# Patient Record
Sex: Female | Born: 1970 | Hispanic: Yes | Marital: Married | Smoking: Never smoker
Health system: Southern US, Community
[De-identification: ages and names within clinical notes are randomized; demographics above are authoritative.]

## PROBLEM LIST (undated history)

## (undated) DIAGNOSIS — K219 Gastro-esophageal reflux disease without esophagitis: Secondary | ICD-10-CM

## (undated) DIAGNOSIS — Z973 Presence of spectacles and contact lenses: Secondary | ICD-10-CM

---

## 2004-11-13 ENCOUNTER — Ambulatory Visit: Payer: Self-pay

## 2013-01-04 ENCOUNTER — Ambulatory Visit: Payer: Self-pay

## 2014-10-02 IMAGING — MG MAM DGTL SCRN MAM NO ORDER W/CAD
1 series · 4 of 4 positions shown · non-contrast
Comparison: none

REASON FOR EXAM: SCR MAMMO NO ORDER BASELINE HAMADAMIN SOMO PH [REDACTED] [id number] [phone]
COMMENTS:

[R CC · right · 4 of 4 slices shown]
[im 1/4]
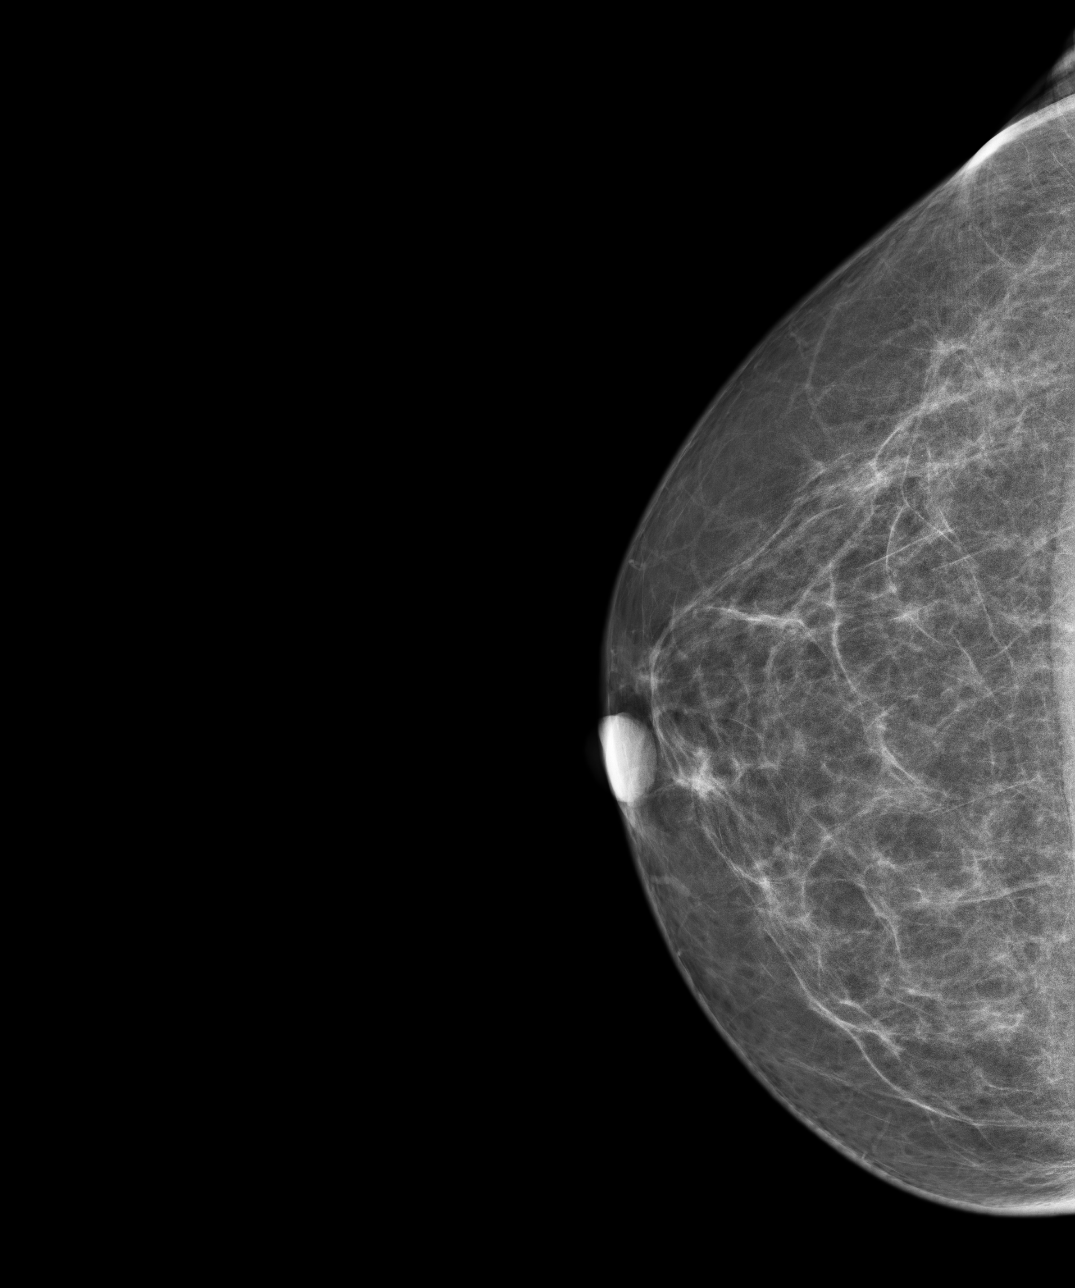
[im 2/4]
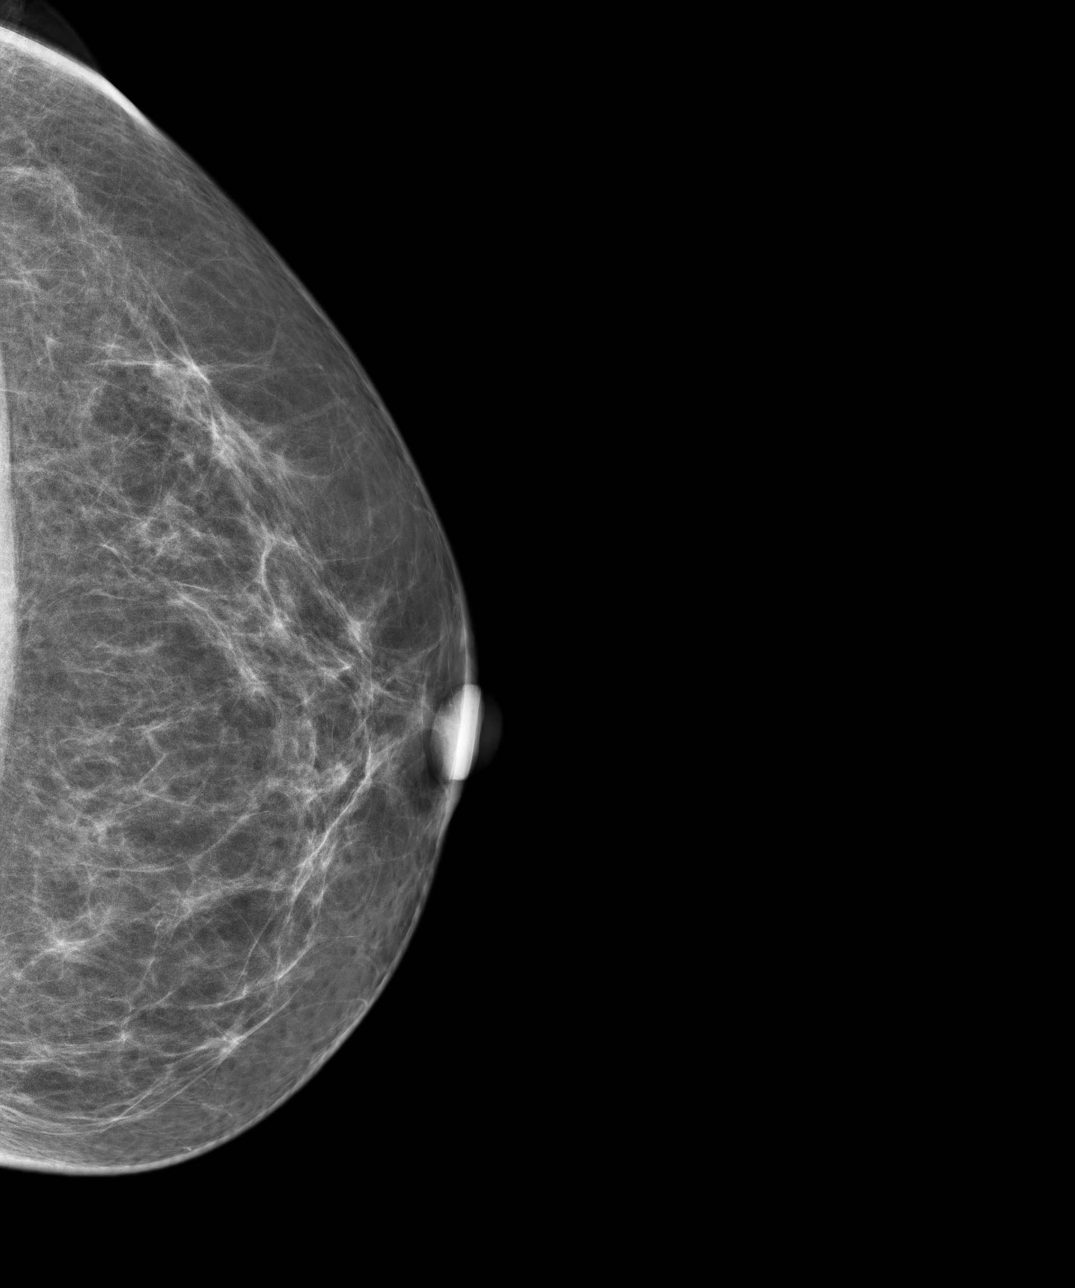
[im 3/4]
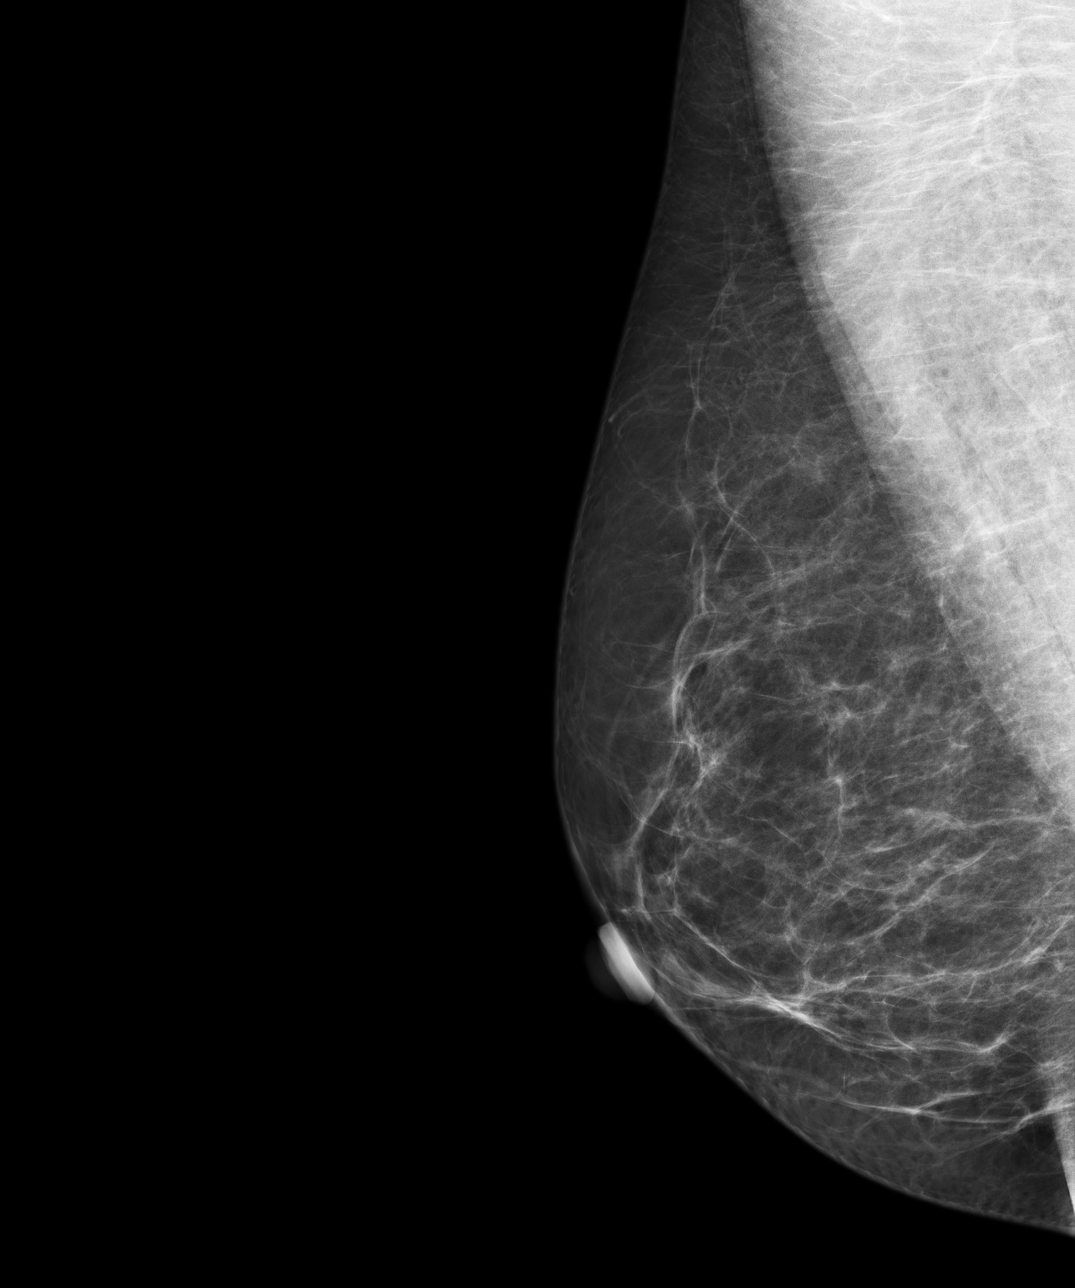
[im 4/4]
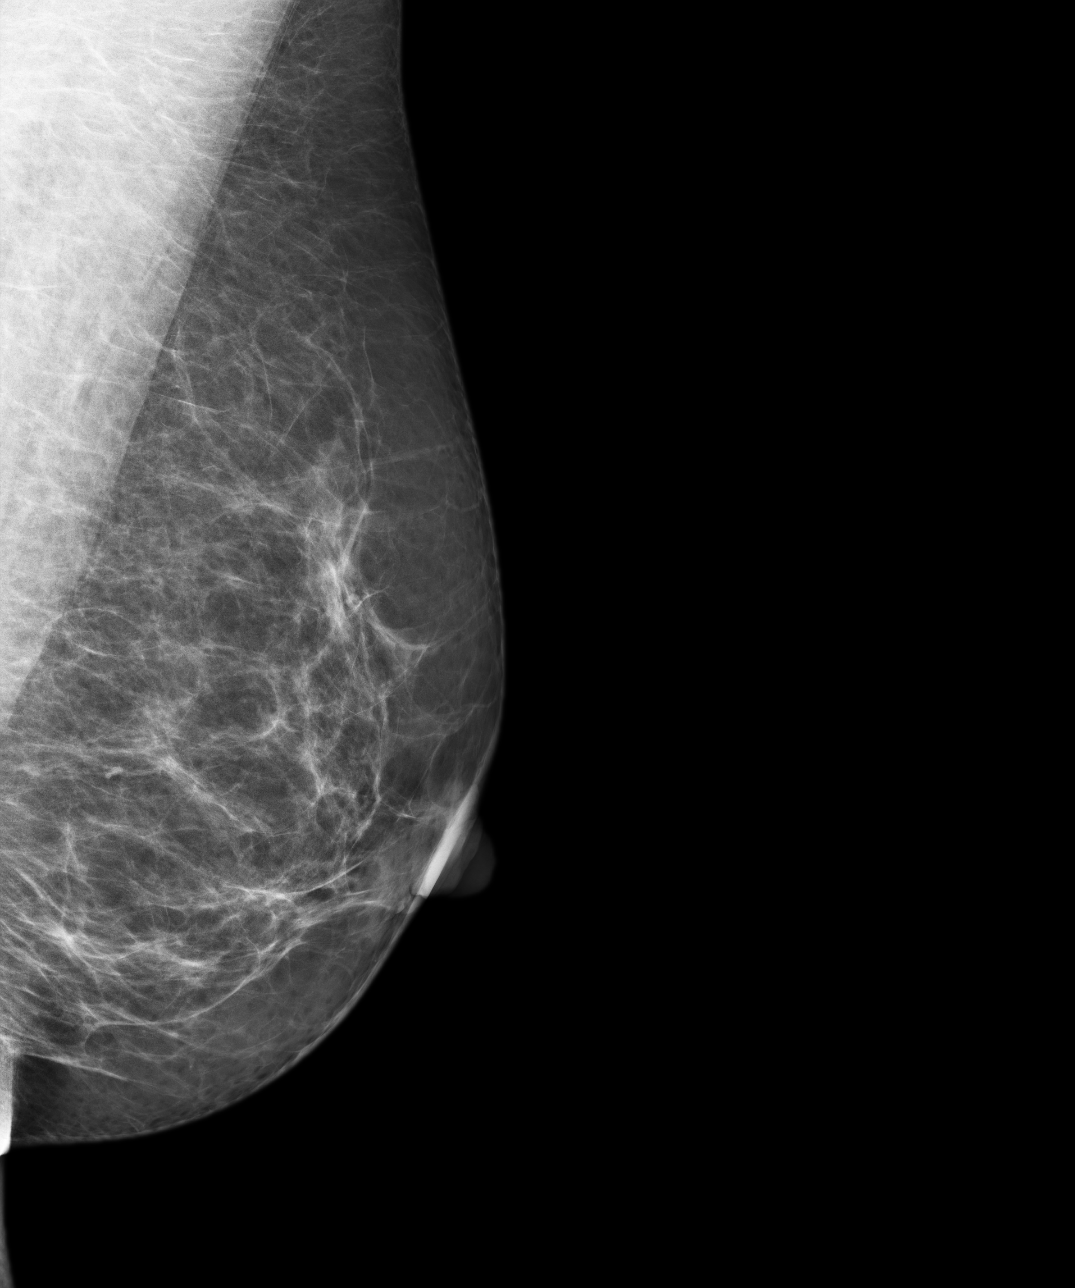

[4 of 4 positions shown; findings below may reference images not displayed]

PROCEDURE:     MAM - MAM DGTL SCRN MAM NO ORDER W/CAD  - January 04, 2013  [DATE]

RESULT:     There is no personal or family history of breast cancer. The
patient denies previous breast surgery. This is a baseline exam. There is
scattered fibroglandular density without a dominant mass, malignant
calcification or architectural distortion.
IMPRESSION: BREAST COMPOSITION: The breast composition is SCATTERED
FIBROGLANDULAR TISSUE (glandular tissue is 25-50%) BI-RADS: Category 1 -
Negative

A NEGATIVE MAMMOGRAM REPORT DOES NOT PRECLUDE BIOPSY OR OTHER EVALUATION OF
A CLINICALLY PALPABLE OR OTHERWISE SUSPICIOUS MASS OR LESION. BREAST CANCER
MAY NOT BE DETECTED IN UP TO 10% OF CASES.

[REDACTED]

## 2018-10-04 ENCOUNTER — Other Ambulatory Visit: Payer: Self-pay | Admitting: Family Medicine

## 2018-10-04 DIAGNOSIS — Z1231 Encounter for screening mammogram for malignant neoplasm of breast: Secondary | ICD-10-CM

## 2019-08-31 ENCOUNTER — Other Ambulatory Visit: Payer: Self-pay | Admitting: Family Medicine

## 2019-08-31 DIAGNOSIS — Z1231 Encounter for screening mammogram for malignant neoplasm of breast: Secondary | ICD-10-CM

## 2019-11-11 ENCOUNTER — Ambulatory Visit: Payer: Self-pay | Attending: Internal Medicine

## 2019-11-11 DIAGNOSIS — Z23 Encounter for immunization: Secondary | ICD-10-CM

## 2019-11-11 NOTE — Progress Notes (Signed)
   Covid-19 Vaccination Clinic  Name:  Neela Zecca    MRN: [id number] DOB: May 01, 1971  11/11/2019  Ms. Alderette Encarnacion Chu was observed post Covid-19 immunization for 15 minutes without incident. She was provided with Vaccine Information Sheet and instruction to access the V-Safe system. Medical interpreter used.  Ms. Particia Nearing was instructed to call 911 with any severe reactions post vaccine: Marland Kitchen Difficulty breathing  . Swelling of face and throat  . A fast heartbeat  . A bad rash all over body  . Dizziness and weakness   Immunizations Administered    Name Date Dose VIS Date Route   Pfizer COVID-19 Vaccine 11/11/2019  4:58 PM 0.3 mL 07/21/2019 Intramuscular   Manufacturer: [employer], [employer]   Lot: [id number]   NDC: [address]-9

## 2019-12-02 ENCOUNTER — Ambulatory Visit: Payer: Self-pay | Attending: Internal Medicine

## 2019-12-02 DIAGNOSIS — Z23 Encounter for immunization: Secondary | ICD-10-CM

## 2019-12-02 NOTE — Progress Notes (Signed)
   Covid-19 Vaccination Clinic  Name:  Latoya Ferguson    MRN: [id number] DOB: 1971/05/10  12/02/2019  Ms. Latoya Ferguson was observed post Covid-19 immunization for 15 minutes without incident. She was provided with Vaccine Information Sheet and instruction to access the V-Safe system. Medical interpreter used.  Ms. Latoya Ferguson was instructed to call 911 with any severe reactions post vaccine: Marland Kitchen Difficulty breathing  . Swelling of face and throat  . A fast heartbeat  . A bad rash all over body  . Dizziness and weakness   Immunizations Administered    Name Date Dose VIS Date Route   Pfizer COVID-19 Vaccine 12/02/2019  4:29 PM 0.3 mL 10/04/2018 Intramuscular   Manufacturer: [employer], [employer]   Lot: [id number]   NDC: [address]-1

## 2020-02-21 DIAGNOSIS — N879 Dysplasia of cervix uteri, unspecified: Secondary | ICD-10-CM | POA: Insufficient documentation

## 2020-11-15 ENCOUNTER — Other Ambulatory Visit: Payer: Self-pay | Admitting: Family Medicine

## 2020-11-15 DIAGNOSIS — Z1231 Encounter for screening mammogram for malignant neoplasm of breast: Secondary | ICD-10-CM

## 2021-09-04 ENCOUNTER — Other Ambulatory Visit: Payer: Self-pay

## 2021-09-04 ENCOUNTER — Encounter: Payer: Self-pay | Admitting: Gastroenterology

## 2021-09-04 ENCOUNTER — Ambulatory Visit (INDEPENDENT_AMBULATORY_CARE_PROVIDER_SITE_OTHER): Payer: Commercial Managed Care - PPO | Admitting: Gastroenterology

## 2021-09-04 VITALS — BP 101/67 | HR 79 | Temp 97.6°F | Ht 60.0 in | Wt 165.1 lb

## 2021-09-04 DIAGNOSIS — R1013 Epigastric pain: Secondary | ICD-10-CM | POA: Diagnosis not present

## 2021-09-04 DIAGNOSIS — Z1211 Encounter for screening for malignant neoplasm of colon: Secondary | ICD-10-CM

## 2021-09-04 MED ORDER — NA SULFATE-K SULFATE-MG SULF 17.5-3.13-1.6 GM/177ML PO SOLN: 354.0000 mL | Freq: Once | ORAL | 0 refills | Status: AC

## 2021-09-04 NOTE — Progress Notes (Signed)
Arlyss Repress, MD [address]  [address]  [place], [place] [address]  Main: [phone]  Fax: [phone]    Gastroenterology Consultation  Referring Provider:     No ref. provider found Primary Care Physician:  Resa Miner, MD Primary Gastroenterologist:  Dr. Arlyss Repress Reason for Consultation:     Nausea, abdominal bloating, left upper quadrant pain, regurgitation        HPI:   Latoya Ferguson is a 51 y.o. female referred by Dr. Resa Miner, MD  for consultation & management of nausea, abdominal bloating, left upper quadrant pain, regurgitation.  Patient reports that she has been suffering from these symptoms intermittently for more than a year.  She was evaluated at [facility] for the above symptoms, was empirically treated with PPI.  She did not undergo any endoscopic evaluation, and H. pylori was not tested at that time as her symptoms were thought to be secondary to reflux.  Patient denies any right upper quadrant discomfort.  She is due for screening colonoscopy as well.  Patient denies any weight loss, melena.  No evidence of anemia.  Patient does not smoke or drink alcohol She works in a factory  NSAIDs: None  Antiplts/Anticoagulants/Anti thrombotics: None  GI Procedures: None She denies family history of GI malignancy  History reviewed. No pertinent past medical history.  History reviewed. No pertinent surgical history.  Current Outpatient Medications:    Na Sulfate-K Sulfate-Mg Sulf 17.5-3.13-1.6 GM/177ML SOLN, Take 354 mLs by mouth once for 1 dose., Disp: 354 mL, Rfl: 0   atorvastatin (LIPITOR) 40 MG tablet, Take by mouth., Disp: , Rfl:    cetirizine (ZYRTEC) 10 MG tablet, Take by mouth., Disp: , Rfl:    cholecalciferol (VITAMIN D) 25 MCG (1000 UNIT) tablet, Take 1,000 Units by mouth daily., Disp: , Rfl:    ergocalciferol (VITAMIN D2) 1.25 MG (50000 UT) capsule, Take by mouth., Disp: , Rfl:    fluticasone (FLONASE) 50 MCG/ACT  nasal spray, Place into both nostrils., Disp: , Rfl:    naproxen (NAPROSYN) 500 MG tablet, Take 500 mg by mouth 2 (two) times daily., Disp: , Rfl:    norgestimate-ethinyl estradiol (ORTHO-CYCLEN) 0.25-35 MG-MCG tablet, Take 1 tablet by mouth daily., Disp: , Rfl:    No family history on file.   Social History   Tobacco Use   Smoking status: Never   Smokeless tobacco: Never  Substance Use Topics   Alcohol use: Never   Drug use: Never    Allergies as of 09/04/2021   (No Known Allergies)    Review of Systems:    All systems reviewed and negative except where noted in HPI.   Physical Exam:  BP 101/67 (BP Location: Left Arm, Patient Position: Sitting, Cuff Size: Normal)    Pulse 79    Temp 97.6 F (36.4 C) (Oral)    Ht 5' (1.524 m)    Wt 165 lb 2 oz (74.9 kg)    BMI 32.25 kg/m  No LMP recorded.  General:   Alert,  Well-developed, well-nourished, pleasant and cooperative in NAD Head:  Normocephalic and atraumatic. Eyes:  Sclera clear, no icterus.   Conjunctiva pink. Ears:  Normal auditory acuity. Nose:  No deformity, discharge, or lesions. Mouth:  No deformity or lesions,oropharynx pink & moist. Neck:  Supple; no masses or thyromegaly. Lungs:  Respirations even and unlabored.  Clear throughout to auscultation.   No wheezes, crackles, or rhonchi. No acute distress. Heart:  Regular rate  and rhythm; no murmurs, clicks, rubs, or gallops. Abdomen:  Normal bowel sounds. Soft, non-tender and non-distended without masses, hepatosplenomegaly or hernias noted.  No guarding or rebound tenderness.   Rectal: Not performed Msk:  Symmetrical without gross deformities. Good, equal movement & strength bilaterally. Pulses:  Normal pulses noted. Extremities:  No clubbing or edema.  No cyanosis. Neurologic:  Alert and oriented x3;  grossly normal neurologically. Skin:  Intact without significant lesions or rashes. No jaundice. Psych:  Alert and cooperative. Normal mood and affect.  Imaging  Studies: No recent abdominal imaging  Assessment and Plan:   Latoya Ferguson is a 51 y.o. Spanish-speaking female with chronic symptoms of dyspepsia without any weight loss or anemia  Dyspepsia Given patient's ethnicity recommend EGD for further evaluation  Colon cancer screening Schedule screening colonoscopy along with EGD  I have discussed alternative options, risks & benefits,  which include, but are not limited to, bleeding, infection, perforation,respiratory complication & drug reaction.  The patient agrees with this plan & written consent will be obtained.     Follow up as needed   Arlyss Repress, MD

## 2021-09-23 ENCOUNTER — Encounter: Payer: Self-pay | Admitting: Gastroenterology

## 2021-09-25 ENCOUNTER — Encounter: Payer: Self-pay | Admitting: Gastroenterology

## 2021-09-25 ENCOUNTER — Encounter
Admission: RE | Disposition: A | Discharge status: Home / Self Care | Payer: Self-pay | Source: Home / Self Care | Attending: Gastroenterology

## 2021-09-25 ENCOUNTER — Ambulatory Visit: Payer: Commercial Managed Care - PPO | Admitting: Anesthesiology

## 2021-09-25 ENCOUNTER — Ambulatory Visit
Admission: RE | Admit: 2021-09-25 | Discharge: 2021-09-25 | Disposition: A | Discharge status: Home / Self Care | Payer: Commercial Managed Care - PPO | Attending: Gastroenterology | Admitting: Gastroenterology

## 2021-09-25 ENCOUNTER — Other Ambulatory Visit: Payer: Self-pay

## 2021-09-25 DIAGNOSIS — K3189 Other diseases of stomach and duodenum: Secondary | ICD-10-CM | POA: Insufficient documentation

## 2021-09-25 DIAGNOSIS — Z1211 Encounter for screening for malignant neoplasm of colon: Secondary | ICD-10-CM | POA: Diagnosis not present

## 2021-09-25 DIAGNOSIS — K219 Gastro-esophageal reflux disease without esophagitis: Secondary | ICD-10-CM | POA: Diagnosis not present

## 2021-09-25 DIAGNOSIS — K259 Gastric ulcer, unspecified as acute or chronic, without hemorrhage or perforation: Secondary | ICD-10-CM

## 2021-09-25 DIAGNOSIS — K2950 Unspecified chronic gastritis without bleeding: Secondary | ICD-10-CM | POA: Diagnosis not present

## 2021-09-25 DIAGNOSIS — R1013 Epigastric pain: Secondary | ICD-10-CM | POA: Diagnosis not present

## 2021-09-25 HISTORY — DX: Gastro-esophageal reflux disease without esophagitis: K21.9

## 2021-09-25 HISTORY — PX: BIOPSY: SHX5522

## 2021-09-25 HISTORY — PX: ESOPHAGOGASTRODUODENOSCOPY (EGD) WITH PROPOFOL: SHX5813

## 2021-09-25 HISTORY — PX: COLONOSCOPY WITH PROPOFOL: SHX5780

## 2021-09-25 HISTORY — DX: Presence of spectacles and contact lenses: Z97.3

## 2021-09-25 LAB — POCT PREGNANCY, URINE: Preg Test, Ur: NEGATIVE

## 2021-09-25 SURGERY — COLONOSCOPY WITH PROPOFOL
Anesthesia: General | Site: Rectum

## 2021-09-25 MED ORDER — OXYCODONE HCL 5 MG PO TABS: 5.0000 mg | ORAL_TABLET | Freq: Once | ORAL | Status: DC | PRN

## 2021-09-25 MED ORDER — PROPOFOL 10 MG/ML IV BOLUS
INTRAVENOUS | Status: DC | PRN
  Administered 2021-09-25: 50 mg via INTRAVENOUS

## 2021-09-25 MED ORDER — LACTATED RINGERS IV SOLN: INTRAVENOUS | Status: DC

## 2021-09-25 MED ORDER — FENTANYL CITRATE PF 50 MCG/ML IJ SOSY: 25.0000 ug | PREFILLED_SYRINGE | INTRAMUSCULAR | Status: DC | PRN

## 2021-09-25 MED ORDER — PROPOFOL 500 MG/50ML IV EMUL
INTRAVENOUS | Status: DC | PRN
  Administered 2021-09-25: 160 ug/kg/min via INTRAVENOUS

## 2021-09-25 MED ORDER — MEPERIDINE HCL 25 MG/ML IJ SOLN: 6.2500 mg | INTRAMUSCULAR | Status: DC | PRN

## 2021-09-25 MED ORDER — SODIUM CHLORIDE 0.9 % IV SOLN: INTRAVENOUS | Status: DC

## 2021-09-25 MED ORDER — LIDOCAINE HCL (CARDIAC) PF 100 MG/5ML IV SOSY
PREFILLED_SYRINGE | INTRAVENOUS | Status: DC | PRN
  Administered 2021-09-25: 60 mg via INTRAVENOUS

## 2021-09-25 MED ORDER — STERILE WATER FOR IRRIGATION IR SOLN
Status: DC | PRN
  Administered 2021-09-25: 150 mL

## 2021-09-25 MED ORDER — OXYCODONE HCL 5 MG/5ML PO SOLN: 5.0000 mg | Freq: Once | ORAL | Status: DC | PRN

## 2021-09-25 MED ORDER — GLYCOPYRROLATE 0.2 MG/ML IJ SOLN
INTRAMUSCULAR | Status: DC | PRN
  Administered 2021-09-25: .1 mg via INTRAVENOUS

## 2021-09-25 MED ORDER — PROMETHAZINE HCL 25 MG/ML IJ SOLN: 6.2500 mg | INTRAMUSCULAR | Status: DC | PRN

## 2021-09-25 SURGICAL SUPPLY — 8 items
BLOCK BITE 60FR ADLT L/F GRN (MISCELLANEOUS) ×3 IMPLANT
FORCEPS BIOP RAD 4 LRG CAP 4 (CUTTING FORCEPS) ×1 IMPLANT
GOWN CVR UNV OPN BCK APRN NK (MISCELLANEOUS) ×4 IMPLANT
GOWN ISOL THUMB LOOP REG UNIV (MISCELLANEOUS) ×6
KIT PRC NS LF DISP ENDO (KITS) ×2 IMPLANT
KIT PROCEDURE OLYMPUS (KITS) ×3
MANIFOLD NEPTUNE II (INSTRUMENTS) ×3 IMPLANT
WATER STERILE IRR 250ML POUR (IV SOLUTION) ×3 IMPLANT

## 2021-09-25 NOTE — Op Note (Signed)
[facility] Gastroenterology Patient Name: Latoya Ferguson Procedure Date: 09/25/2021 7:19 AM MRN: [id number] Account #: [id number] Date of Birth: June 03, 1971 Admit Type: Outpatient Age: 51 Room: [facility] OR ROOM 01 Gender: Female Note Status: Finalized Instrument Name: [id number] Procedure:             Upper GI endoscopy Indications:           Dyspepsia Providers:             Lin Landsman MD, MD Medicines:             General Anesthesia Complications:         No immediate complications. Estimated blood loss: None. Procedure:             Pre-Anesthesia Assessment:                        - Prior to the procedure, a History and Physical was                         performed, and patient medications and allergies were                         reviewed. The patient is competent. The risks and                         benefits of the procedure and the sedation options and                         risks were discussed with the patient. All questions                         were answered and informed consent was obtained.                         Patient identification and proposed procedure were                         verified by the physician, the nurse, the                         anesthesiologist, the anesthetist and the technician                         in the pre-procedure area in the procedure room in the                         endoscopy suite. Mental Status Examination: alert and                         oriented. Airway Examination: normal oropharyngeal                         airway and neck mobility. Respiratory Examination:                         clear to auscultation. CV Examination: normal.                         Prophylactic Antibiotics: The patient does  not require                         prophylactic antibiotics. Prior Anticoagulants: The                         patient has taken no previous anticoagulant or                         antiplatelet  agents. ASA Grade Assessment: II - A                         patient with mild systemic disease. After reviewing                         the risks and benefits, the patient was deemed in                         satisfactory condition to undergo the procedure. The                         anesthesia plan was to use general anesthesia.                         Immediately prior to administration of medications,                         the patient was re-assessed for adequacy to receive                         sedatives. The heart rate, respiratory rate, oxygen                         saturations, blood pressure, adequacy of pulmonary                         ventilation, and response to care were monitored                         throughout the procedure. The physical status of the                         patient was re-assessed after the procedure.                        After obtaining informed consent, the endoscope was                         passed under direct vision. Throughout the procedure,                         the patient's blood pressure, pulse, and oxygen                         saturations were monitored continuously. The Endoscope                         was introduced through the mouth, and advanced to the  second part of duodenum. The upper GI endoscopy was                         accomplished without difficulty. The patient tolerated                         the procedure well. Findings:      The duodenal bulb and second portion of the duodenum were normal.      Multiple localized 4 to 10 mm erosions with no bleeding and no stigmata       of recent bleeding were found in the gastric antrum. Biopsies were taken       with a cold forceps for histology. Biopsies were taken with a cold       forceps for histology.      The gastric body and incisura were normal. Biopsies were taken with a       cold forceps for Helicobacter pylori testing.      The cardia  and gastric fundus were normal on retroflexion.      Esophagogastric landmarks were identified: the gastroesophageal junction       was found at 38 cm from the incisors.      The gastroesophageal junction and examined esophagus were normal. Impression:            - Normal duodenal bulb and second portion of the                         duodenum.                        - Erosive gastropathy with no bleeding and no stigmata                         of recent bleeding. Biopsied.                        - Normal gastric body and incisura. Biopsied.                        - Esophagogastric landmarks identified.                        - Normal gastroesophageal junction and esophagus. Recommendation:        - Await pathology results.                        - Use Prilosec (omeprazole) 40 mg PO daily for 1 month.                        - Proceed with colonoscopy as scheduled                        See colonoscopy report                        - No aspirin, ibuprofen, naproxen, or other                         non-steroidal anti-inflammatory drugs. Procedure Code(s):     --- Professional ---  [id number], Esophagogastroduodenoscopy, flexible,                         transoral; with biopsy, single or multiple Diagnosis Code(s):     --- Professional ---                        K31.89, Other diseases of stomach and duodenum                        R10.13, Epigastric pain CPT copyright 2019 American Medical Association. All rights reserved. The codes documented in this report are preliminary and upon coder review may  be revised to meet current compliance requirements. Dr. Ulyess Mort Lin Landsman MD, MD 09/25/2021 8:12:48 AM This report has been signed electronically. Number of Addenda: 0 Note Initiated On: 09/25/2021 7:19 AM Total Procedure Duration: 0 hours 3 minutes 44 seconds  Estimated Blood Loss:  Estimated blood loss: none.      [facility]

## 2021-09-25 NOTE — Op Note (Signed)
[facility] Gastroenterology Patient Name: Latoya Ferguson Procedure Date: 09/25/2021 7:19 AM MRN: [id number] Account #: [id number] Date of Birth: May 11, 1971 Admit Type: Outpatient Age: 51 Room: [facility] OR ROOM 01 Gender: Female Note Status: Finalized Instrument Name: [id number] Procedure:             Colonoscopy Indications:           Screening for colorectal malignant neoplasm, This is                         the patient's first colonoscopy Providers:             Toney Reil MD, MD Medicines:             General Anesthesia Complications:         No immediate complications. Estimated blood loss: None. Procedure:             Pre-Anesthesia Assessment:                        - Prior to the procedure, a History and Physical was                         performed, and patient medications and allergies were                         reviewed. The patient is competent. The risks and                         benefits of the procedure and the sedation options and                         risks were discussed with the patient. All questions                         were answered and informed consent was obtained.                         Patient identification and proposed procedure were                         verified by the physician, the nurse, the                         anesthesiologist, the anesthetist and the technician                         in the pre-procedure area in the procedure room in the                         endoscopy suite. Mental Status Examination: alert and                         oriented. Airway Examination: normal oropharyngeal                         airway and neck mobility. Respiratory Examination:                         clear to auscultation.  CV Examination: normal.                         Prophylactic Antibiotics: The patient does not require                         prophylactic antibiotics. Prior Anticoagulants: The                          patient has taken no previous anticoagulant or                         antiplatelet agents. ASA Grade Assessment: II - A                         patient with mild systemic disease. After reviewing                         the risks and benefits, the patient was deemed in                         satisfactory condition to undergo the procedure. The                         anesthesia plan was to use general anesthesia.                         Immediately prior to administration of medications,                         the patient was re-assessed for adequacy to receive                         sedatives. The heart rate, respiratory rate, oxygen                         saturations, blood pressure, adequacy of pulmonary                         ventilation, and response to care were monitored                         throughout the procedure. The physical status of the                         patient was re-assessed after the procedure.                        After obtaining informed consent, the colonoscope was                         passed under direct vision. Throughout the procedure,                         the patient's blood pressure, pulse, and oxygen                         saturations were monitored continuously. The  Colonoscope was introduced through the anus and                         advanced to the the terminal ileum, with                         identification of the appendiceal orifice and IC                         valve. The colonoscopy was performed without                         difficulty. The patient tolerated the procedure well.                         The quality of the bowel preparation was evaluated                         using the BBPS [facility] Bowel Preparation Scale) with                         scores of: Right Colon = 3, Transverse Colon = 3 and                         Left Colon = 3 (entire mucosa seen well with no                          residual staining, small fragments of stool or opaque                         liquid). The total BBPS score equals 9. Findings:      The perianal and digital rectal examinations were normal. Pertinent       negatives include normal sphincter tone and no palpable rectal lesions.      The terminal ileum appeared normal.      The entire examined colon appeared normal.      The retroflexed view of the distal rectum and anal verge was normal and       showed no anal or rectal abnormalities. Impression:            - The examined portion of the ileum was normal.                        - The entire examined colon is normal.                        - The distal rectum and anal verge are normal on                         retroflexion view.                        - No specimens collected. Recommendation:        - Discharge patient to home (with escort).                        - Resume previous diet today.                        -  Continue present medications.                        - Repeat colonoscopy in 10 years for screening                         purposes. Procedure Code(s):     --- Professional ---                        I0165, Colorectal cancer screening; colonoscopy on                         individual not meeting criteria for high risk Diagnosis Code(s):     --- Professional ---                        Z12.11, Encounter for screening for malignant neoplasm                         of colon CPT copyright 2019 American Medical Association. All rights reserved. The codes documented in this report are preliminary and upon coder review may  be revised to meet current compliance requirements. Dr. Libby Maw Toney Reil MD, MD 09/25/2021 8:27:13 AM This report has been signed electronically. Number of Addenda: 0 Note Initiated On: 09/25/2021 7:19 AM Scope Withdrawal Time: 0 hours 8 minutes 23 seconds  Total Procedure Duration: 0 hours 10 minutes 54 seconds  Estimated Blood Loss:   Estimated blood loss: none.      [facility]

## 2021-09-25 NOTE — Anesthesia Procedure Notes (Signed)
Date/Time: 09/25/2021 8:02 AM Performed by: Lily Kocher, CRNA Pre-anesthesia Checklist: Patient identified, Emergency Drugs available, Suction available, Patient being monitored and Timeout performed Patient Re-evaluated:Patient Re-evaluated prior to induction Oxygen Delivery Method: Nasal cannula Induction Type: IV induction Placement Confirmation: positive ETCO2

## 2021-09-25 NOTE — Anesthesia Preprocedure Evaluation (Signed)
Anesthesia Evaluation  Patient identified by MRN, date of birth, ID band Patient awake    Reviewed: Allergy & Precautions, H&P , NPO status , Patient's Chart, lab work & pertinent test results, reviewed documented beta blocker date and time   Airway Mallampati: II  TM Distance: >3 FB Neck ROM: full    Dental no notable dental hx.    Pulmonary neg pulmonary ROS,    Pulmonary exam normal breath sounds clear to auscultation       Cardiovascular Exercise Tolerance: Good negative cardio ROS   Rhythm:regular Rate:Normal     Neuro/Psych negative neurological ROS  negative psych ROS   GI/Hepatic Neg liver ROS, GERD  ,  Endo/Other  negative endocrine ROS  Renal/GU negative Renal ROS  negative genitourinary   Musculoskeletal   Abdominal   Peds  Hematology negative hematology ROS (+)   Anesthesia Other Findings   Reproductive/Obstetrics negative OB ROS                             Anesthesia Physical Anesthesia Plan  ASA: 2  Anesthesia Plan: General   Post-op Pain Management:    Induction:   PONV Risk Score and Plan: 3 and Propofol infusion and Treatment may vary due to age or medical condition  Airway Management Planned:   Additional Equipment:   Intra-op Plan:   Post-operative Plan:   Informed Consent: I have reviewed the patients History and Physical, chart, labs and discussed the procedure including the risks, benefits and alternatives for the proposed anesthesia with the patient or authorized representative who has indicated his/her understanding and acceptance.     Dental Advisory Given  Plan Discussed with: CRNA  Anesthesia Plan Comments:         Anesthesia Quick Evaluation

## 2021-09-25 NOTE — H&P (Signed)
Arlyss Repress, MD [address]  [address]  [place], [place] [address]  Main: [phone]  Fax: [phone] Pager: [phone]  Primary Care Physician:  Resa Miner, MD Primary Gastroenterologist:  Dr. Arlyss Repress  Pre-Procedure History & Physical: HPI:  Latoya Ferguson is a 51 y.o. female is here for an endoscopy and colonoscopy.   Past Medical History:  Diagnosis Date   GERD (gastroesophageal reflux disease)    Wears contact lenses     History reviewed. No pertinent surgical history.  Prior to Admission medications   Medication Sig Start Date End Date Taking? Authorizing Provider  atorvastatin (LIPITOR) 40 MG tablet Take by mouth. Patient not taking: Reported on 09/23/2021    [provider]  cetirizine (ZYRTEC) 10 MG tablet Take by mouth. Patient not taking: Reported on 09/23/2021    [provider]  cholecalciferol (VITAMIN D) 25 MCG (1000 UNIT) tablet Take 1,000 Units by mouth daily. Patient not taking: Reported on 09/23/2021 05/14/21   [provider]  ergocalciferol (VITAMIN D2) 1.25 MG (50000 UT) capsule Take by mouth. Patient not taking: Reported on 09/23/2021    [provider]  fluticasone (FLONASE) 50 MCG/ACT nasal spray Place into both nostrils. Patient not taking: Reported on 09/23/2021 05/06/21   [provider]  naproxen (NAPROSYN) 500 MG tablet Take 500 mg by mouth 2 (two) times daily. Patient not taking: Reported on 09/23/2021 05/14/21   [provider]  norgestimate-ethinyl estradiol (ORTHO-CYCLEN) 0.25-35 MG-MCG tablet Take 1 tablet by mouth daily. Patient not taking: Reported on 09/23/2021 03/27/20   [provider]    Allergies as of 09/04/2021   (No Known Allergies)    History reviewed. No pertinent family history.  Social History   Socioeconomic History   Marital status: Married    Spouse name: Not on file   Number of children: Not on file   Years of  education: Not on file   Highest education level: Not on file  Occupational History   Not on file  Tobacco Use   Smoking status: Never   Smokeless tobacco: Never  Vaping Use   Vaping Use: Never used  Substance and Sexual Activity   Alcohol use: Never   Drug use: Never   Sexual activity: Not on file  Other Topics Concern   Not on file  Social History Narrative   Not on file   Social Determinants of Health   Financial Resource Strain: Not on file  Food Insecurity: Not on file  Transportation Needs: Not on file  Physical Activity: Not on file  Stress: Not on file  Social Connections: Not on file  Intimate Partner Violence: Not on file    Review of Systems: See HPI, otherwise negative ROS  Physical Exam: BP 112/76    Pulse 63    Temp (!) 97.5 F (36.4 C) (Temporal)    Resp 18    Ht 5' (1.524 m)    Wt 71.4 kg    LMP 09/19/2021 (Exact Date) Comment: u preg neg   SpO2 97%    BMI 30.72 kg/m  General:   Alert,  pleasant and cooperative in NAD Head:  Normocephalic and atraumatic. Neck:  Supple; no masses or thyromegaly. Lungs:  Clear throughout to auscultation.    Heart:  Regular rate and rhythm. Abdomen:  Soft, nontender and nondistended. Normal bowel sounds, without guarding, and without rebound.   Neurologic:  Alert and  oriented x4;  grossly normal neurologically.  Impression/Plan: Byrd Hesselbach  Beecher Mcardle is here for an endoscopy and colonoscopy to be performed for dyspepsia, colon cancer screening  Risks, benefits, limitations, and alternatives regarding  endoscopy and colonoscopy have been reviewed with the patient.  Questions have been answered.  All parties agreeable.   Lannette Donath, MD  09/25/2021, 7:50 AM

## 2021-09-25 NOTE — Anesthesia Postprocedure Evaluation (Signed)
Anesthesia Post Note  Patient: Latoya Ferguson  Procedure(s) Performed: COLONOSCOPY WITH PROPOFOL (Rectum) ESOPHAGOGASTRODUODENOSCOPY (EGD) WITH PROPOFOL (Esophagus) BIOPSY (Esophagus)     Patient location during evaluation: PACU Anesthesia Type: General Level of consciousness: awake and alert Pain management: pain level controlled Vital Signs Assessment: post-procedure vital signs reviewed and stable Respiratory status: spontaneous breathing, nonlabored ventilation, respiratory function stable and patient connected to nasal cannula oxygen Cardiovascular status: blood pressure returned to baseline and stable Postop Assessment: no apparent nausea or vomiting Anesthetic complications: no   No notable events documented.  Bethel Gaglio, Glade Stanford

## 2021-09-25 NOTE — Transfer of Care (Signed)
Immediate Anesthesia Transfer of Care Note  Patient: Latoya Ferguson  Procedure(s) Performed: COLONOSCOPY WITH PROPOFOL (Rectum) ESOPHAGOGASTRODUODENOSCOPY (EGD) WITH PROPOFOL (Esophagus) BIOPSY (Esophagus)  Patient Location: PACU  Anesthesia Type: General  Level of Consciousness: awake, alert  and patient cooperative  Airway and Oxygen Therapy: Patient Spontanous Breathing and Patient connected to supplemental oxygen  Post-op Assessment: Post-op Vital signs reviewed, Patient's Cardiovascular Status Stable, Respiratory Function Stable, Patent Airway and No signs of Nausea or vomiting  Post-op Vital Signs: Reviewed and stable  Complications: No notable events documented.

## 2021-09-26 ENCOUNTER — Encounter: Payer: Self-pay | Admitting: Gastroenterology

## 2021-09-29 LAB — SURGICAL PATHOLOGY

## 2021-09-30 ENCOUNTER — Telehealth: Payer: Self-pay

## 2021-09-30 DIAGNOSIS — R1013 Epigastric pain: Secondary | ICD-10-CM

## 2021-09-30 NOTE — Telephone Encounter (Signed)
Used the interpreter service and they call patient and patient verbalized understanding of results. Called and got patient schedule with ultrasound 10/06/21 at 10:00am [place] [facility] [place] [facility] 9:30am. Nothing to eat or drink after midnight . Patient verbalized understanding of times.

## 2021-09-30 NOTE — Telephone Encounter (Signed)
-----   Message from Toney Reil, MD sent at 09/29/2021 11:15 PM EST ----- [name]  Please inform patient that the pathology results from upper endoscopy came back negative for any infection.  Recommend right upper quadrant ultrasound to evaluate for gallstones  RV

## 2021-10-06 ENCOUNTER — Ambulatory Visit
Admission: RE | Admit: 2021-10-06 | Discharge: 2021-10-06 | Disposition: A | Discharge status: Home / Self Care | Payer: Commercial Managed Care - PPO | Source: Ambulatory Visit | Attending: Gastroenterology | Admitting: Gastroenterology

## 2021-10-06 DIAGNOSIS — R1013 Epigastric pain: Secondary | ICD-10-CM | POA: Diagnosis not present

## 2021-10-08 ENCOUNTER — Telehealth: Payer: Self-pay

## 2021-10-08 MED ORDER — OMEPRAZOLE 40 MG PO CPDR: 40.0000 mg | DELAYED_RELEASE_CAPSULE | Freq: Every day | ORAL | 0 refills | Status: AC

## 2021-10-08 NOTE — Telephone Encounter (Signed)
-----   Message from Toney Reil, MD sent at 10/07/2021 11:38 PM EST ----- ?Upper quadrant ultrasound revealed mild fatty liver only.  There are no gallstones.  Recommend empiric trial of omeprazole 40 mg once daily before breakfast for 1 month for her upper GI symptoms if patient is agreeable ? ?RV ?

## 2021-10-08 NOTE — Telephone Encounter (Signed)
Patient daughter verbalized understanding and will let the patient know the results. Sent medication to the pharmacy.  ?

## 2022-01-16 ENCOUNTER — Other Ambulatory Visit: Payer: Self-pay | Admitting: Student

## 2022-01-16 DIAGNOSIS — Z1231 Encounter for screening mammogram for malignant neoplasm of breast: Secondary | ICD-10-CM

## 2022-05-27 ENCOUNTER — Other Ambulatory Visit: Payer: Self-pay | Admitting: Family Medicine

## 2022-05-27 DIAGNOSIS — Z1231 Encounter for screening mammogram for malignant neoplasm of breast: Secondary | ICD-10-CM

## 2022-09-14 ENCOUNTER — Ambulatory Visit
Admission: RE | Admit: 2022-09-14 | Discharge: 2022-09-14 | Disposition: A | Discharge status: Home / Self Care | Payer: BLUE CROSS/BLUE SHIELD | Source: Ambulatory Visit | Attending: Family Medicine | Admitting: Family Medicine

## 2022-09-14 DIAGNOSIS — Z1231 Encounter for screening mammogram for malignant neoplasm of breast: Secondary | ICD-10-CM

## 2022-09-16 ENCOUNTER — Inpatient Hospital Stay
Admission: RE | Admit: 2022-09-16 | Discharge: 2022-09-16 | Disposition: A | Discharge status: Home / Self Care | Payer: Self-pay | Source: Ambulatory Visit | Attending: *Deleted | Admitting: *Deleted

## 2022-09-16 ENCOUNTER — Other Ambulatory Visit: Payer: Self-pay | Admitting: *Deleted

## 2022-09-16 DIAGNOSIS — Z1231 Encounter for screening mammogram for malignant neoplasm of breast: Secondary | ICD-10-CM

## 2023-07-04 IMAGING — US US ABDOMEN LIMITED
1 series · 14 of 25 positions shown · non-contrast
Comparison: None.

CLINICAL DATA: Dyspepsia.

EXAM:
ULTRASOUND ABDOMEN LIMITED RIGHT UPPER QUADRANT

[Series 1: us abdomen limited ruq (liver/gb) · 14 of 56 slices shown]
[im 1/56]
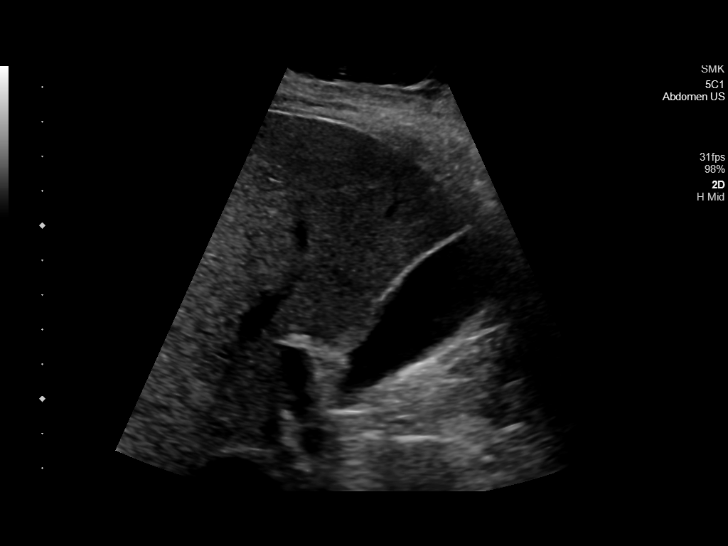
[im 5/56]
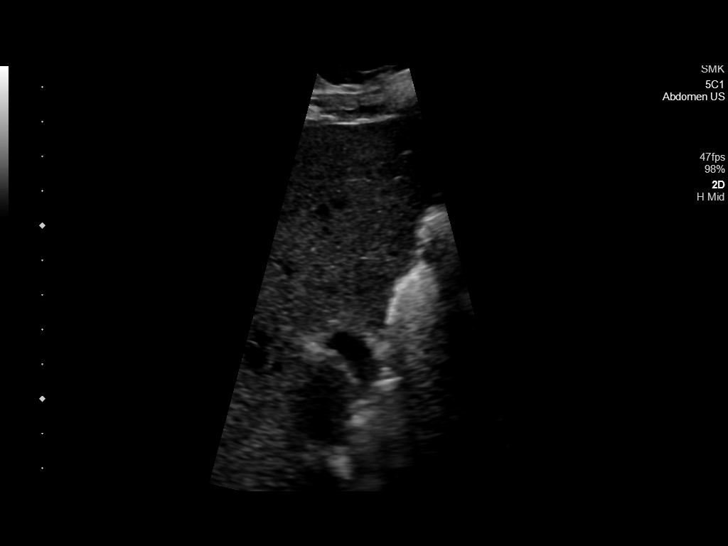
[im 10/56]
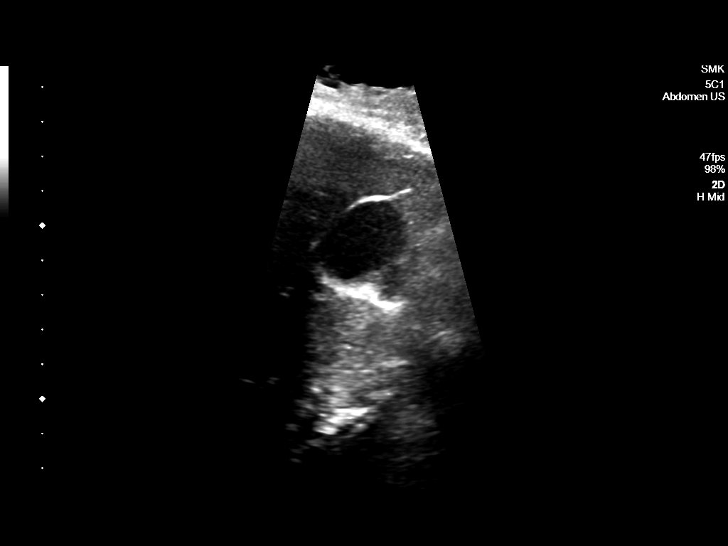
[im 14/56]
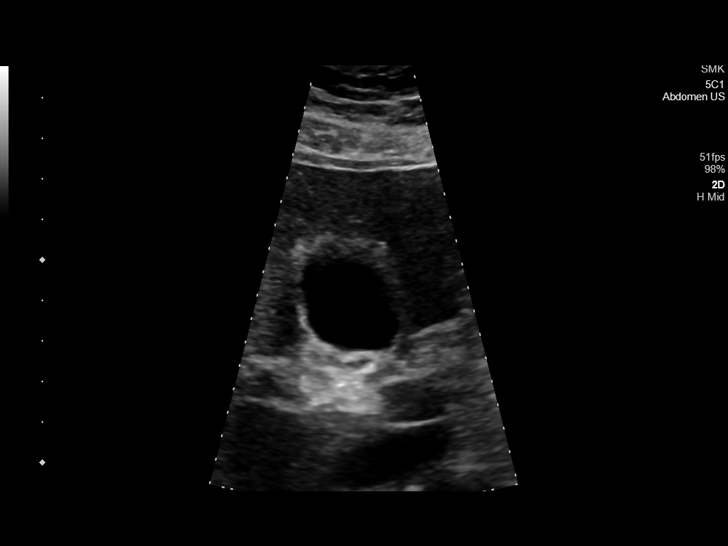
[im 19/56]
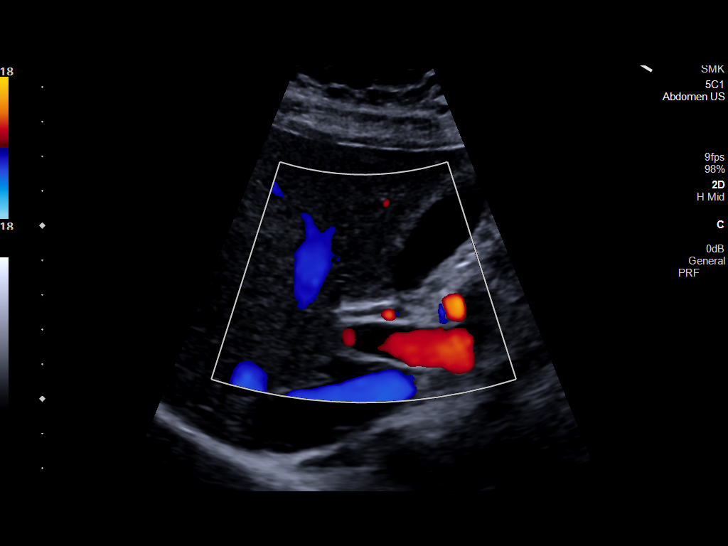
[im 21/56]
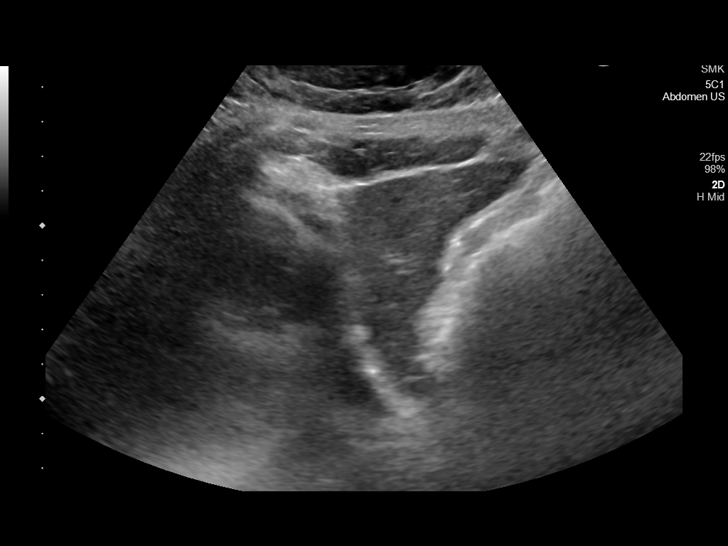
[im 26/56]
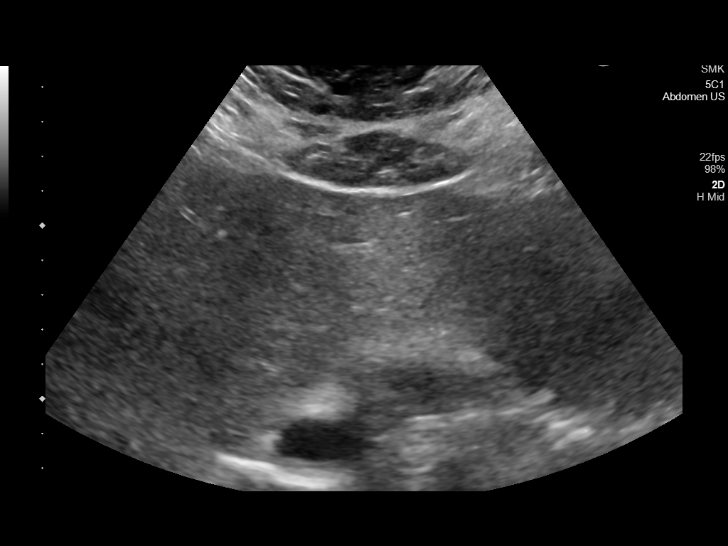
[im 30/56]
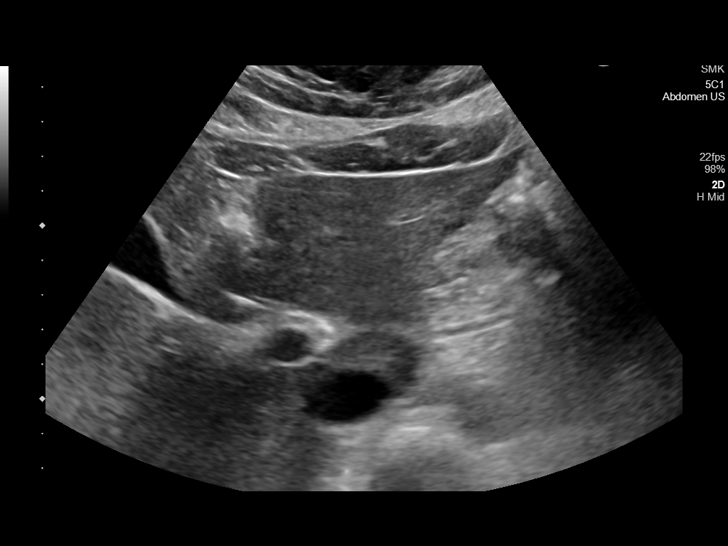
[im 35/56]
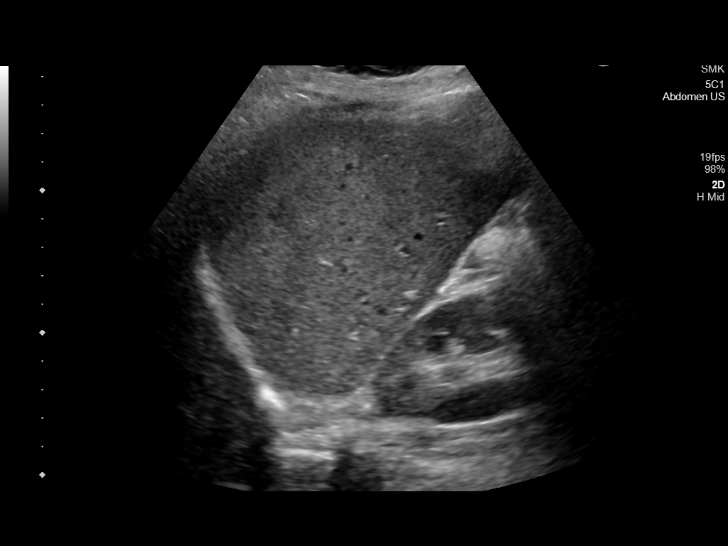
[im 37/56]
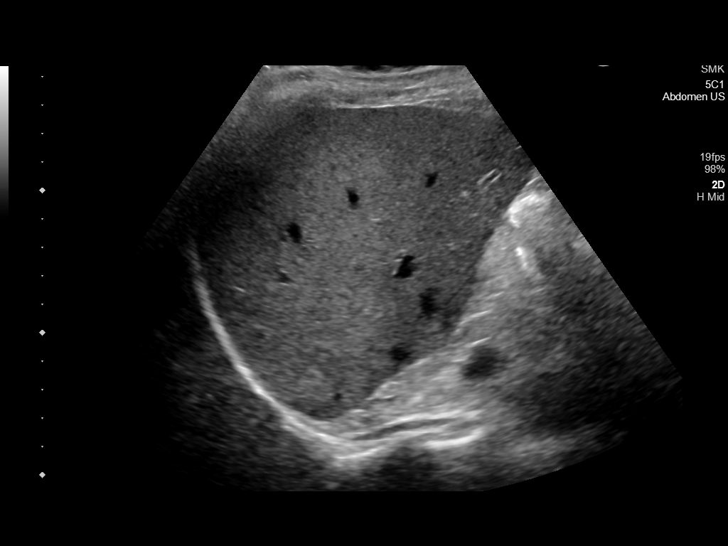
[im 42/56]
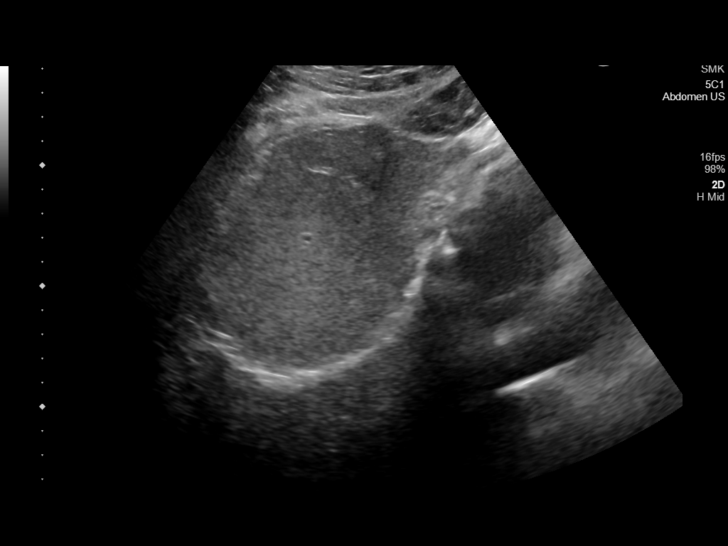
[im 46/56]
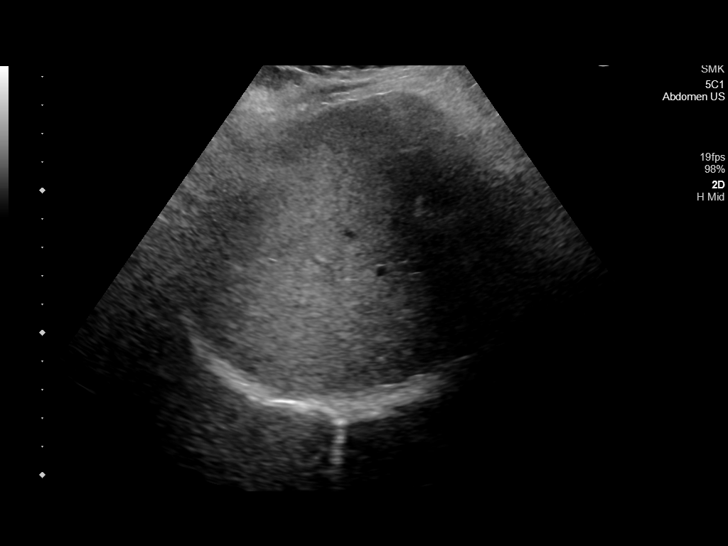
[im 51/56]
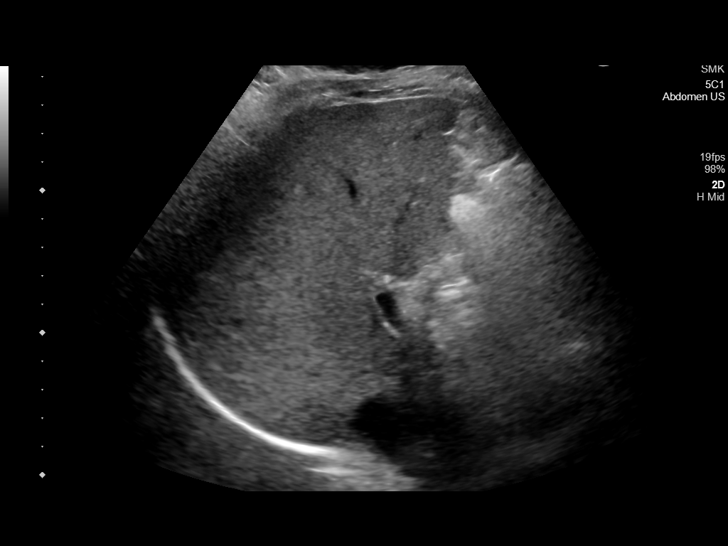
[im 56/56]
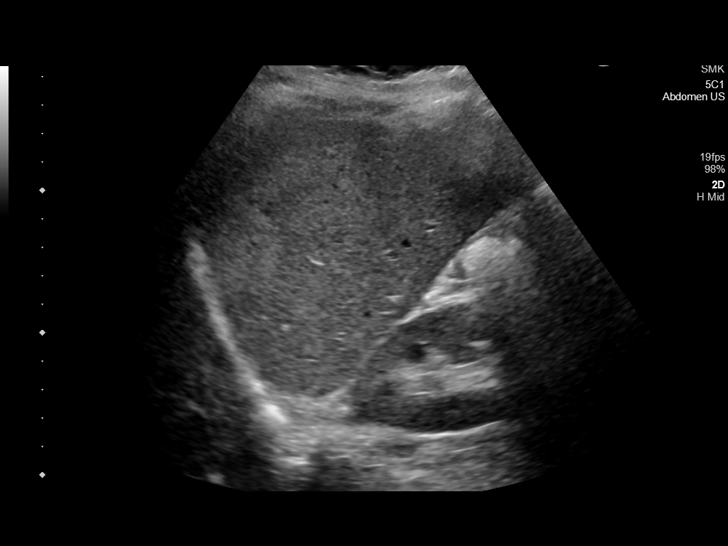

[14 of 25 positions shown; findings below may reference images not displayed]

FINDINGS: Gallbladder:

No gallstones or wall thickening visualized (1.7 mm). No sonographic
Murphy sign noted by sonographer.

Common bile duct:

Diameter: 2.7 mm

Liver:

No focal lesion identified. Diffuse, heterogeneous, mildly increased
echogenicity of the liver parenchyma is noted. Portal vein is patent
on color Doppler imaging with normal direction of blood flow towards
the liver.

Other: None.
IMPRESSION: 1. No evidence of cholelithiasis.
2. Findings suggestive of mild hepatic steatosis, without focal
liver lesions.
